# Patient Record
Sex: Male | Born: 1980 | Race: White | Hispanic: No | Marital: Married | Smoking: Never smoker
Health system: Southern US, Community
[De-identification: ages and names within clinical notes are randomized; demographics above are authoritative.]

## PROBLEM LIST (undated history)

## (undated) HISTORY — PX: APPENDECTOMY: SHX54

---

## 2006-04-27 ENCOUNTER — Emergency Department (HOSPITAL_COMMUNITY): Admission: EM | Admit: 2006-04-27 | Discharge: 2006-04-27 | Payer: Self-pay | Admitting: Emergency Medicine

## 2009-04-03 ENCOUNTER — Emergency Department (HOSPITAL_BASED_OUTPATIENT_CLINIC_OR_DEPARTMENT_OTHER): Admission: EM | Admit: 2009-04-03 | Discharge: 2009-04-03 | Payer: Self-pay | Admitting: Emergency Medicine

## 2011-01-28 LAB — CBC
HCT: 44.3 % (ref 39.0–52.0)
Hemoglobin: 15.1 g/dL (ref 13.0–17.0)
MCV: 92.9 fL (ref 78.0–100.0)
Platelets: 295 10*3/uL (ref 150–400)
RBC: 4.77 MIL/uL (ref 4.22–5.81)
WBC: 9.3 10*3/uL (ref 4.0–10.5)

## 2011-01-28 LAB — URINALYSIS, ROUTINE W REFLEX MICROSCOPIC
Glucose, UA: NEGATIVE mg/dL
Hgb urine dipstick: NEGATIVE
Specific Gravity, Urine: 1.02 (ref 1.005–1.030)
pH: 7 (ref 5.0–8.0)

## 2011-01-28 LAB — DIFFERENTIAL
Basophils Absolute: 0 10*3/uL (ref 0.0–0.1)
Basophils Relative: 0 % (ref 0–1)
Eosinophils Relative: 1 % (ref 0–5)
Monocytes Absolute: 0.7 10*3/uL (ref 0.1–1.0)
Neutro Abs: 7.4 10*3/uL (ref 1.7–7.7)

## 2011-01-28 LAB — COMPREHENSIVE METABOLIC PANEL
Albumin: 4.5 g/dL (ref 3.5–5.2)
Alkaline Phosphatase: 91 U/L (ref 39–117)
BUN: 12 mg/dL (ref 6–23)
CO2: 29 mEq/L (ref 19–32)
Chloride: 100 mEq/L (ref 96–112)
GFR calc non Af Amer: >60 mL/min (ref >60)
Potassium: 4 mEq/L (ref 3.5–5.1)
Total Bilirubin: 0.4 mg/dL (ref 0.3–1.2)

## 2011-01-28 LAB — ROCKY MTN SPOTTED FVR AB, IGG-BLOOD: RMSF IgG: 0.11 IV

## 2011-02-17 ENCOUNTER — Emergency Department (INDEPENDENT_AMBULATORY_CARE_PROVIDER_SITE_OTHER): Payer: 59

## 2011-02-17 ENCOUNTER — Emergency Department (HOSPITAL_BASED_OUTPATIENT_CLINIC_OR_DEPARTMENT_OTHER)
Admission: EM | Admit: 2011-02-17 | Discharge: 2011-02-17 | Disposition: A | Discharge status: Home / Self Care | Payer: 59 | Attending: Emergency Medicine | Admitting: Emergency Medicine

## 2011-02-17 DIAGNOSIS — Z79899 Other long term (current) drug therapy: Secondary | ICD-10-CM | POA: Insufficient documentation

## 2011-02-17 DIAGNOSIS — R509 Fever, unspecified: Secondary | ICD-10-CM | POA: Insufficient documentation

## 2011-02-17 DIAGNOSIS — F17200 Nicotine dependence, unspecified, uncomplicated: Secondary | ICD-10-CM

## 2011-02-17 LAB — COMPREHENSIVE METABOLIC PANEL
Alkaline Phosphatase: 88 U/L (ref 39–117)
BUN: 14 mg/dL (ref 6–23)
Calcium: 9.3 mg/dL (ref 8.4–10.5)
Glucose, Bld: 78 mg/dL (ref 70–99)
Total Protein: 7.9 g/dL (ref 6.0–8.3)

## 2011-02-17 LAB — URINALYSIS, ROUTINE W REFLEX MICROSCOPIC
Ketones, ur: NEGATIVE mg/dL
Nitrite: NEGATIVE
Protein, ur: NEGATIVE mg/dL
Urobilinogen, UA: 0.2 mg/dL (ref 0.0–1.0)

## 2011-02-17 LAB — DIFFERENTIAL
Basophils Relative: 0 % (ref 0–1)
Eosinophils Absolute: 0.1 10*3/uL (ref 0.0–0.7)
Monocytes Absolute: 1.2 10*3/uL — ABNORMAL HIGH (ref 0.1–1.0)
Monocytes Relative: 12 % (ref 3–12)
Neutrophils Relative %: 71 % (ref 43–77)

## 2011-02-17 LAB — CBC
MCH: 31.8 pg (ref 26.0–34.0)
MCHC: 35.6 g/dL (ref 30.0–36.0)
Platelets: 233 10*3/uL (ref 150–400)

## 2012-05-02 IMAGING — CR DG CHEST 2V
2 series · 2 of 2 positions shown · non-contrast
Comparison: None.

CLINICAL DATA: Fever.  Smoker.  Prior history of meningitis and
Koukos Kazaki fever.

CHEST - 2 VIEW 02/17/2011:

[w chest pa]
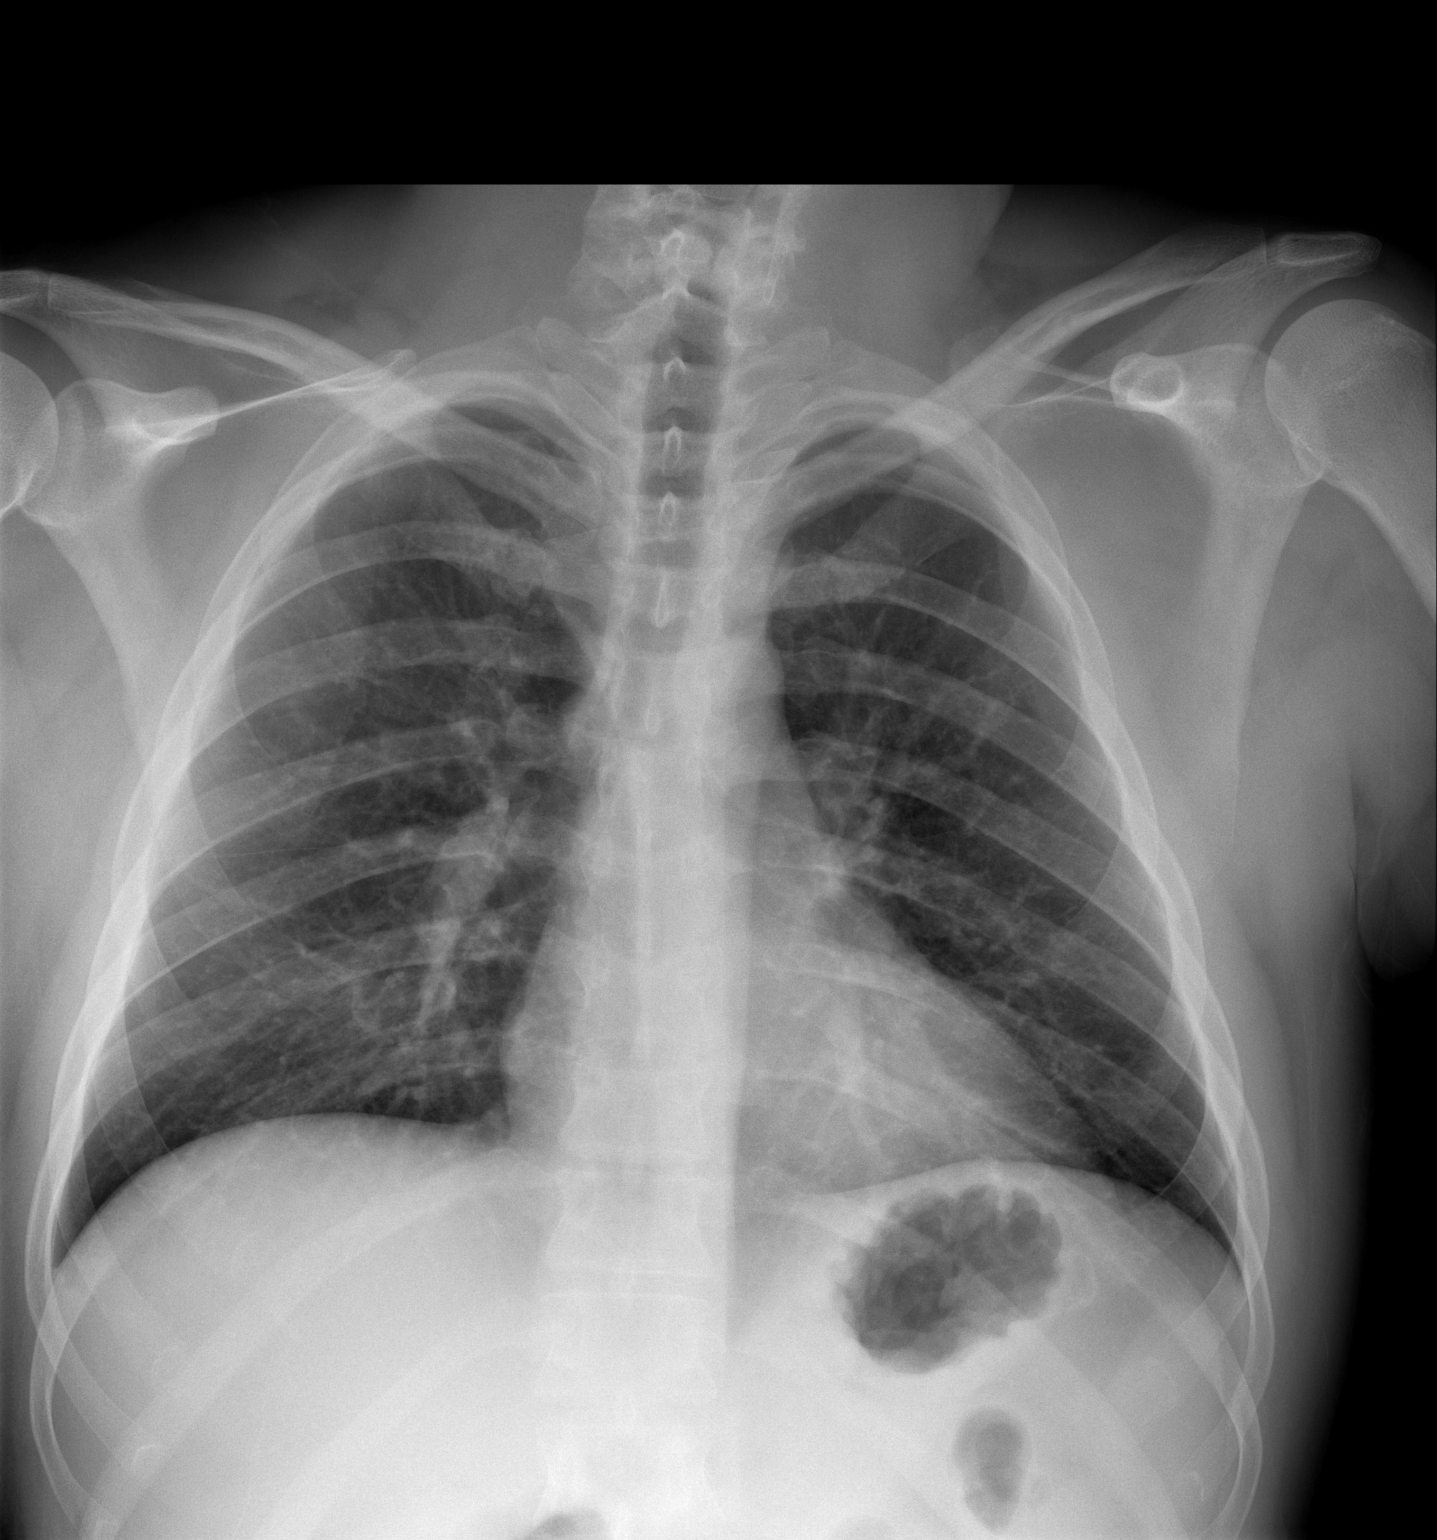

[w chest lat]
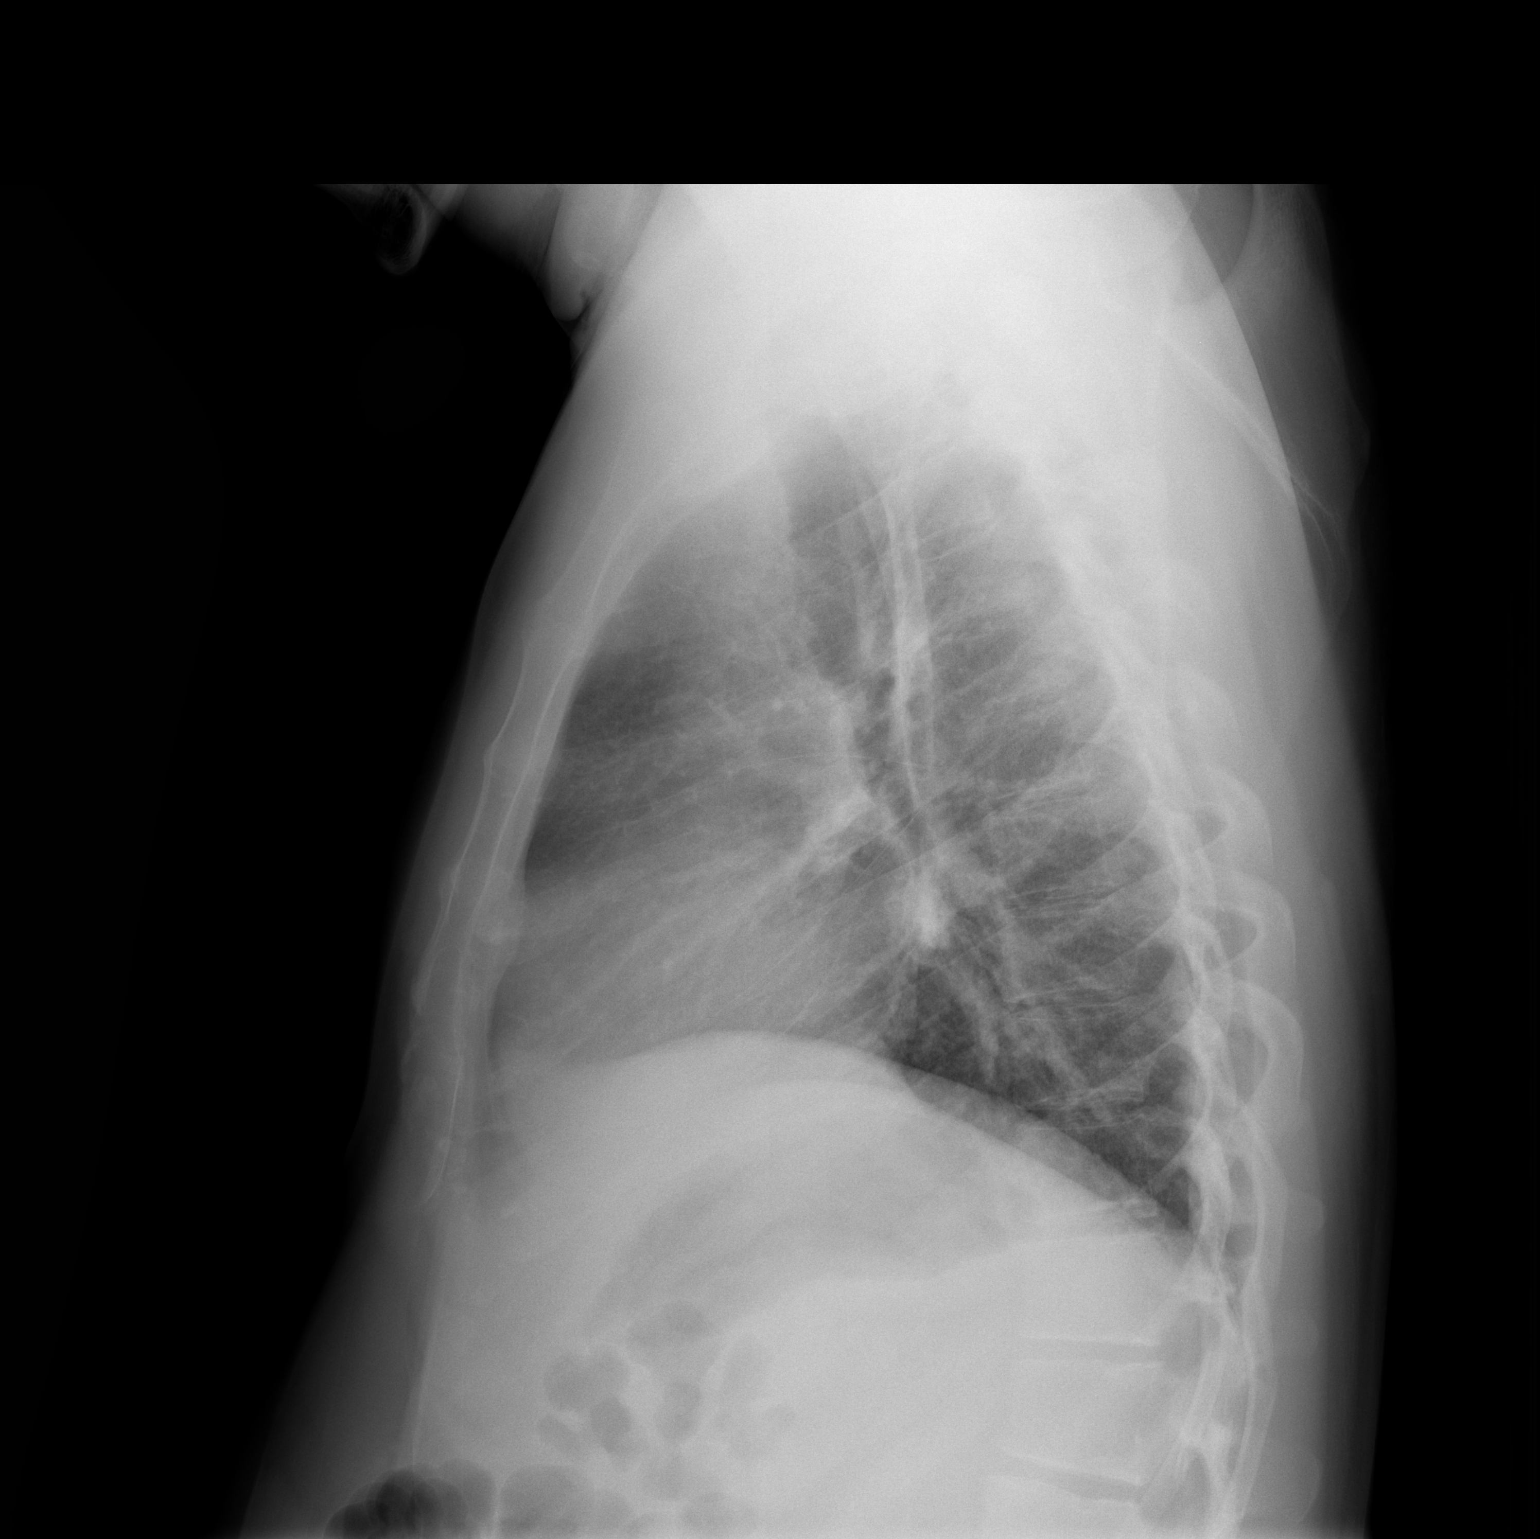

[2 of 2 positions shown; findings below may reference images not displayed]

FINDINGS: Cardiomediastinal silhouette unremarkable.  Mildly
prominent bronchovascular markings diffusely and mild central
peribronchial thickening.  Lungs otherwise clear.  No pleural
effusions.  Visualized bony thorax intact.
IMPRESSION: Mild changes of bronchitis and/or asthma without acute
abnormalities otherwise.

## 2013-07-02 ENCOUNTER — Ambulatory Visit: Payer: 59

## 2015-12-06 ENCOUNTER — Encounter (HOSPITAL_BASED_OUTPATIENT_CLINIC_OR_DEPARTMENT_OTHER): Payer: Self-pay

## 2015-12-06 ENCOUNTER — Emergency Department (HOSPITAL_BASED_OUTPATIENT_CLINIC_OR_DEPARTMENT_OTHER)
Admission: EM | Admit: 2015-12-06 | Discharge: 2015-12-06 | Disposition: A | Discharge status: Home / Self Care | Payer: 59 | Attending: Emergency Medicine | Admitting: Emergency Medicine

## 2015-12-06 ENCOUNTER — Emergency Department (HOSPITAL_BASED_OUTPATIENT_CLINIC_OR_DEPARTMENT_OTHER): Payer: 59

## 2015-12-06 DIAGNOSIS — Z79899 Other long term (current) drug therapy: Secondary | ICD-10-CM | POA: Diagnosis not present

## 2015-12-06 DIAGNOSIS — R55 Syncope and collapse: Secondary | ICD-10-CM | POA: Diagnosis present

## 2015-12-06 DIAGNOSIS — R42 Dizziness and giddiness: Secondary | ICD-10-CM | POA: Insufficient documentation

## 2015-12-06 DIAGNOSIS — R112 Nausea with vomiting, unspecified: Secondary | ICD-10-CM | POA: Insufficient documentation

## 2015-12-06 LAB — BASIC METABOLIC PANEL
Anion gap: 12 (ref 5–15)
BUN: 17 mg/dL (ref 6–20)
CHLORIDE: 102 mmol/L (ref 101–111)
CO2: 24 mmol/L (ref 22–32)
Calcium: 9.3 mg/dL (ref 8.9–10.3)
Creatinine, Ser: 0.93 mg/dL (ref 0.61–1.24)
GFR calc Af Amer: >60 mL/min (ref >60)
GFR calc non Af Amer: >60 mL/min (ref >60)
GLUCOSE: 152 mg/dL — AB (ref 65–99)
POTASSIUM: 3.4 mmol/L — AB (ref 3.5–5.1)
Sodium: 138 mmol/L (ref 135–145)

## 2015-12-06 LAB — CBC WITH DIFFERENTIAL/PLATELET
Basophils Absolute: 0 10*3/uL (ref 0.0–0.1)
Basophils Relative: 0 %
Eosinophils Absolute: 0.2 10*3/uL (ref 0.0–0.7)
Eosinophils Relative: 1 %
HEMATOCRIT: 44 % (ref 39.0–52.0)
HEMOGLOBIN: 15.2 g/dL (ref 13.0–17.0)
LYMPHS ABS: 1.5 10*3/uL (ref 0.7–4.0)
LYMPHS PCT: 9 %
MCH: 30.8 pg (ref 26.0–34.0)
MCHC: 34.5 g/dL (ref 30.0–36.0)
MCV: 89.2 fL (ref 78.0–100.0)
Monocytes Absolute: 1 10*3/uL (ref 0.1–1.0)
Monocytes Relative: 6 %
NEUTROS PCT: 84 %
Neutro Abs: 13.4 10*3/uL — ABNORMAL HIGH (ref 1.7–7.7)
Platelets: 318 10*3/uL (ref 150–400)
RBC: 4.93 MIL/uL (ref 4.22–5.81)
RDW: 11.6 % (ref 11.5–15.5)
WBC: 16.1 10*3/uL — AB (ref 4.0–10.5)

## 2015-12-06 MED ORDER — METOCLOPRAMIDE HCL 5 MG/ML IJ SOLN
10.0000 mg | Freq: Once | INTRAMUSCULAR | Status: AC
  Administered 2015-12-06: 10 mg via INTRAVENOUS
  Filled 2015-12-06: qty 2

## 2015-12-06 MED ORDER — ONDANSETRON HCL 4 MG/2ML IJ SOLN
4.0000 mg | Freq: Once | INTRAMUSCULAR | Status: AC
  Administered 2015-12-06: 4 mg via INTRAVENOUS
  Filled 2015-12-06: qty 2

## 2015-12-06 MED ORDER — ONDANSETRON 8 MG PO TBDP: 8.0000 mg | ORAL_TABLET | Freq: Three times a day (TID) | ORAL | Status: AC | PRN

## 2015-12-06 MED ORDER — SODIUM CHLORIDE 0.9 % IV BOLUS (SEPSIS)
1000.0000 mL | Freq: Once | INTRAVENOUS | Status: AC
  Administered 2015-12-06: 1000 mL via INTRAVENOUS

## 2015-12-06 MED ORDER — SODIUM CHLORIDE 0.9 % IV BOLUS (SEPSIS)
1000.0000 mL | Freq: Once | INTRAVENOUS | Status: AC
Start: 2015-12-06 — End: 2015-12-06
  Administered 2015-12-06: 1000 mL via INTRAVENOUS

## 2015-12-06 NOTE — ED Notes (Signed)
Patient transported to CT 

## 2015-12-06 NOTE — ED Notes (Signed)
Pt c/o not being able to take deep breath, put on O2 nasal cannula for comfort and relaxation.  Pt continues to voice small improvement with fluids.

## 2015-12-06 NOTE — ED Notes (Signed)
Pt ambulated to restroom without assistance.

## 2015-12-06 NOTE — ED Notes (Signed)
Pt verbalizes understanding of d/c instructions and denies any further need at this time. 

## 2015-12-06 NOTE — ED Notes (Signed)
Pt tolerated small sips of PO fluids

## 2015-12-06 NOTE — ED Provider Notes (Signed)
CSN: [id number]     Arrival date & time 12/06/15  1191 History   First MD Initiated Contact with Patient 12/06/15 [phone]     Chief Complaint  Patient presents with  . Syncope      (Consider location/radiation/quality/duration/timing/severity/associated sxs/prior Treatment) HPI  This is a 35 year old male who got up about an hour prior to arrival complaining of abdominal discomfort and nausea. He took Gas-X without relief. His wife, a paramedic, noted him to be pale and diaphoretic and he complained of feeling lightheaded. He subsequently began vomiting profusely during which had a syncopal episode. He was unconscious for about 1-1/2 minutes. There was some jerking suggestive of seizure-like activity. He continues to feel weak, has a dry mouth and he feels like he is having difficulty getting words out though he is oriented 4. He denies hitting his head.  History reviewed. No pertinent past medical history. Past Surgical History  Procedure Laterality Date  . Appendectomy     Family History  Problem Relation Age of Onset  . Stroke Mother    Social History  Substance Use Topics  . Smoking status: Never Smoker   . Smokeless tobacco: None  . Alcohol Use: None    Review of Systems  All other systems reviewed and are negative.   Allergies  Review of patient's allergies indicates no known allergies.  Home Medications   Prior to Admission medications   Medication Sig Start Date End Date Taking? Authorizing Provider  desvenlafaxine (PRISTIQ) 50 MG 24 hr tablet Take 50 mg by mouth daily.    Historical Provider, MD   BP 122/81 mmHg  Pulse 76  Temp(Src) 97.8 F (36.6 C) (Oral)  Resp 15  Ht  (1.727 m)  Wt 180 lb (81.647 kg)  BMI 27.38 kg/m2  SpO2 98%   Physical Exam  General: Well-developed, well-nourished male in no acute distress; appearance consistent with age of record HENT: normocephalic; bite mark to right side of tongue Eyes: pupils equal, round and reactive to  light; extraocular muscles intact Neck: supple Heart: regular rate and rhythm Lungs: clear to auscultation bilaterally Abdomen: soft; nondistended; nontender; no masses or hepatosplenomegaly; bowel sounds present Extremities: No deformity; full range of motion; pulses normal Neurologic: Awake, drowsy, oriented; motor function intact in all extremities and symmetric; no facial droop Skin: Warm and dry Psychiatric: Flat affect  ED Course  Procedures (including critical care time)   EKG Interpretation   Date/Time:  Wednesday December 06 2015 04:29:05 EST Ventricular Rate:  82 PR Interval:  164 QRS Duration: 86 QT Interval:  366 QTC Calculation: 427 R Axis:   3 Text Interpretation:  Sinus rhythm Low voltage, precordial leads No  previous ECGs available Confirmed by Taliah Porche  MD, Jonny Ruiz ([id number]) on 12/06/2015  4:32:04 AM      MDM  Nursing notes and vitals signs, including pulse oximetry, reviewed.  Summary of this visit's results, reviewed by myself:  Labs:  Results for orders placed or performed during the hospital encounter of 12/06/15 (from the past 24 hour(s))  CBC with Differential/Platelet     Status: Abnormal   Collection Time: 12/06/15  4:46 AM  Result Value Ref Range   WBC 16.1 (H) 4.0 - 10.5 K/uL   RBC 4.93 4.22 - 5.81 MIL/uL   Hemoglobin 15.2 13.0 - 17.0 g/dL   HCT 30.8 65.7 - 84.6 %   MCV 89.2 78.0 - 100.0 fL   MCH 30.8 26.0 - 34.0 pg   MCHC 34.5 30.0 - 36.0 g/dL  RDW 11.6 11.5 - 15.5 %   Platelets 318 150 - 400 K/uL   Neutrophils Relative % 84 %   Neutro Abs 13.4 (H) 1.7 - 7.7 K/uL   Lymphocytes Relative 9 %   Lymphs Abs 1.5 0.7 - 4.0 K/uL   Monocytes Relative 6 %   Monocytes Absolute 1.0 0.1 - 1.0 K/uL   Eosinophils Relative 1 %   Eosinophils Absolute 0.2 0.0 - 0.7 K/uL   Basophils Relative 0 %   Basophils Absolute 0.0 0.0 - 0.1 K/uL  Basic metabolic panel     Status: Abnormal   Collection Time: 12/06/15  4:46 AM  Result Value Ref Range   Sodium 138  135 - 145 mmol/L   Potassium 3.4 (L) 3.5 - 5.1 mmol/L   Chloride 102 101 - 111 mmol/L   CO2 24 22 - 32 mmol/L   Glucose, Bld 152 (H) 65 - 99 mg/dL   BUN 17 6 - 20 mg/dL   Creatinine, Ser 1.61 0.61 - 1.24 mg/dL   Calcium 9.3 8.9 - 09.6 mg/dL   GFR calc non Af Amer >60 >60 mL/min   GFR calc Af Amer >60 >60 mL/min   Anion gap 12 5 - 15    Imaging Studies: Ct Head Wo Contrast  12/06/2015  CLINICAL DATA:  Abdominal pain and nausea, near syncope. EXAM: CT HEAD WITHOUT CONTRAST TECHNIQUE: Contiguous axial images were obtained from the base of the skull through the vertex without intravenous contrast. COMPARISON:  None. FINDINGS: The ventricles and sulci are normal. No intraparenchymal hemorrhage, mass effect nor midline shift. No acute large vascular territory infarcts. No abnormal extra-axial fluid collections. Basal cisterns are patent. No skull fracture. The included ocular globes and orbital contents are non-suspicious. The mastoid aircells and included paranasal sinuses are well-aerated. IMPRESSION: Negative CT head. Electronically Signed   By: Awilda Metro M.D.   On: 12/06/2015 05:28   6:28 AM This drinking fluids without emesis. Has been ambulating without difficulty. Continues to feel weak. His wife is a paramedic who will observe him for any worsening symptoms. History is more consistent with a gastrointestinal virus causing a syncopal episode as opposed to a seizure. The nausea, vomiting and abdominal pain preceded his loss of consciousness.  Paula Libra, MD 12/06/15 [phone]

## 2015-12-06 NOTE — ED Notes (Signed)
Pt expressing some relief of symptoms from start of fluids and nausea medication.  Pt remains oriented and talking, still has generalized weakness and is not able to fully participate in care.  Wife remains at bedside, vital signs stable and pt on monitor.

## 2015-12-06 NOTE — ED Notes (Signed)
Pt woke with sudden onset middle abdominal pain and vomiting.  Pt was in the restroom when he called out to his wife stating that something wasn't right and became very diaphoretic and pale and he passed out per wife for approximately 2 minutes and started "posturing" for about 30 minutes.  Pt is very weak, slightly pale, c/o dry mouth and states he continues to have abdominal pain.  Pt is oriented times four, denies hitting head.

## 2015-12-06 NOTE — ED Notes (Signed)
Pt continues to show improvement, no delay in speech and was able to assist in moving self over in CT.

## 2017-02-18 IMAGING — CT CT HEAD W/O CM
2 series · 16 of 30 positions shown, 18 images · non-contrast
Comparison: None.

CLINICAL DATA: Abdominal pain and nausea, near syncope.

EXAM:
CT HEAD WITHOUT CONTRAST
TECHNIQUE: Contiguous axial images were obtained from the base of the skull
through the vertex without intravenous contrast.

[Series 2: head wo · axial · 0.49mm/px · z∈[-140,-15]mm · 8 of 33 slices shown, 10 images]
[im 4/33  brain]
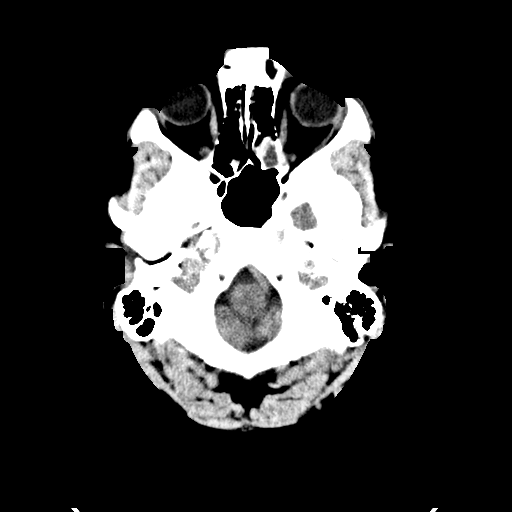
[im 4/33  bone]
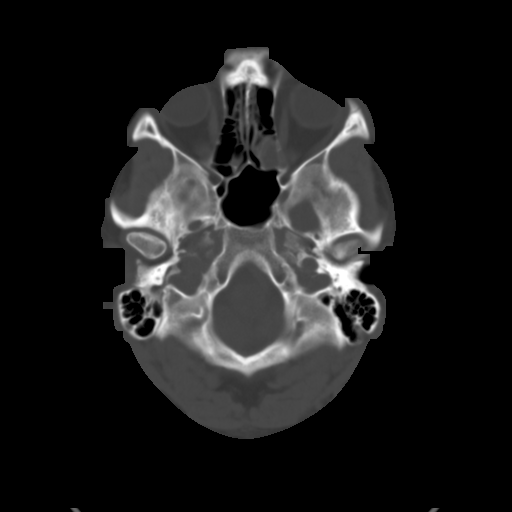
[im 8/33  brain]
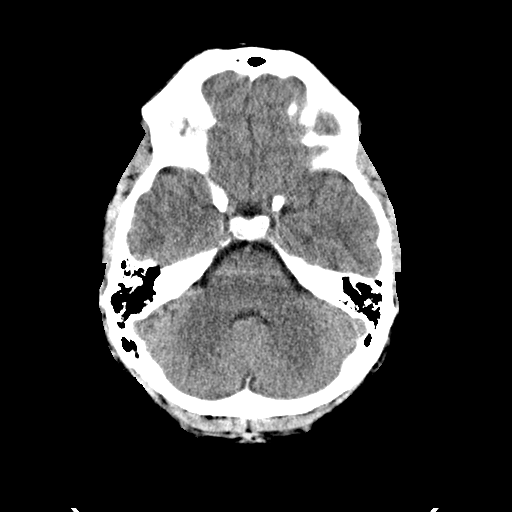
[im 11/33  brain]
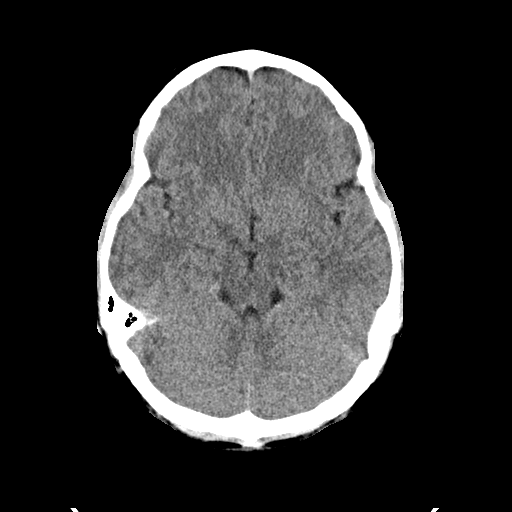
[im 15/33  brain]
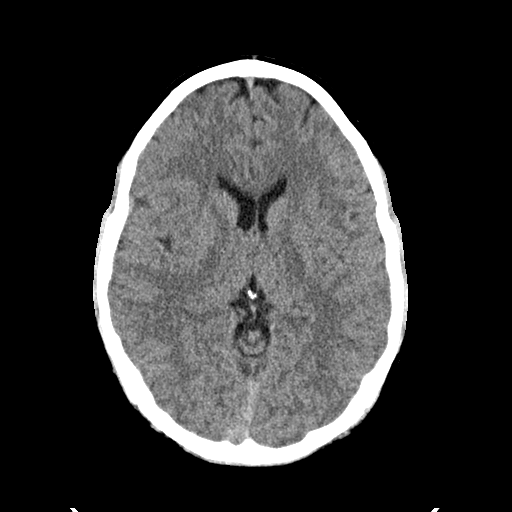
[im 18/33  brain]
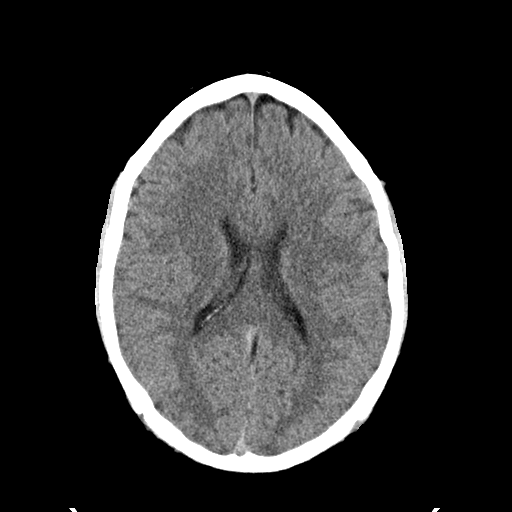
[im 18/33  bone]
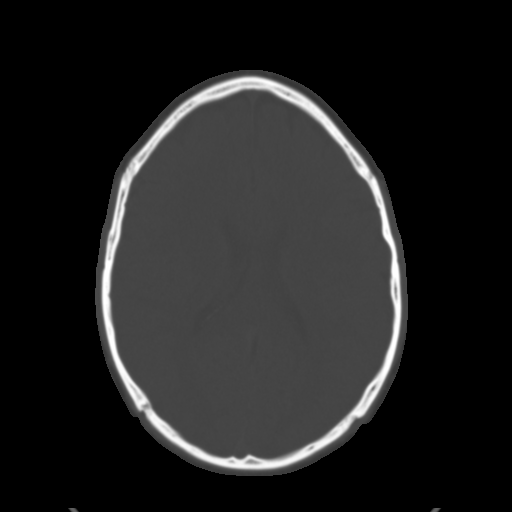
[im 22/33  brain]
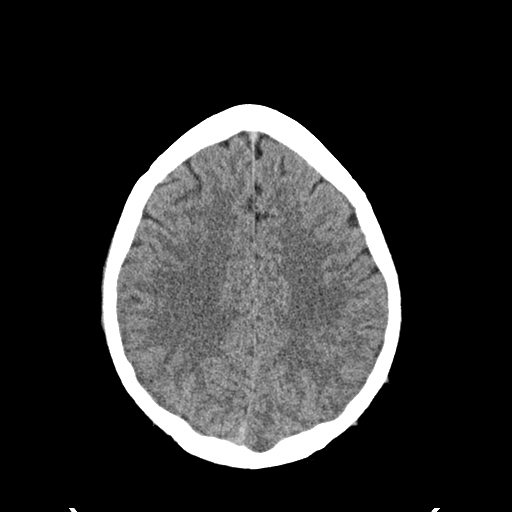
[im 25/33  brain]
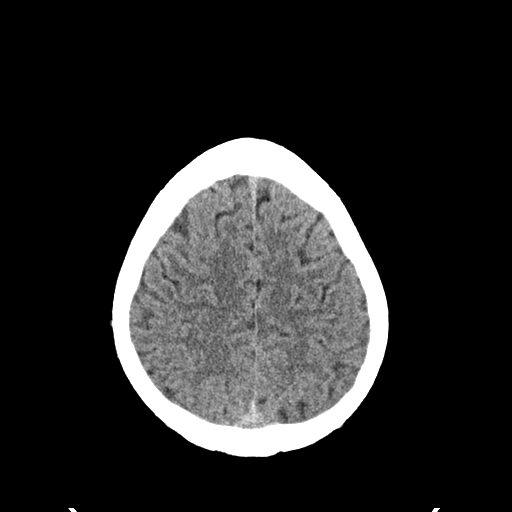
[im 29/33  brain]
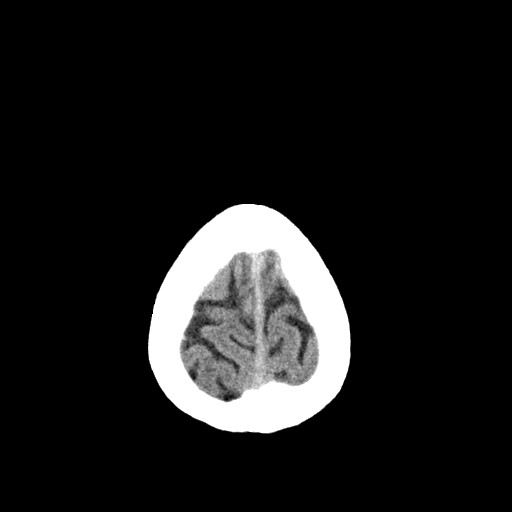

[Series 3: head bone · axial · 0.49mm/px · z∈[-141,-11]mm · 8 of 66 slices shown]
[im 7/66  bone]
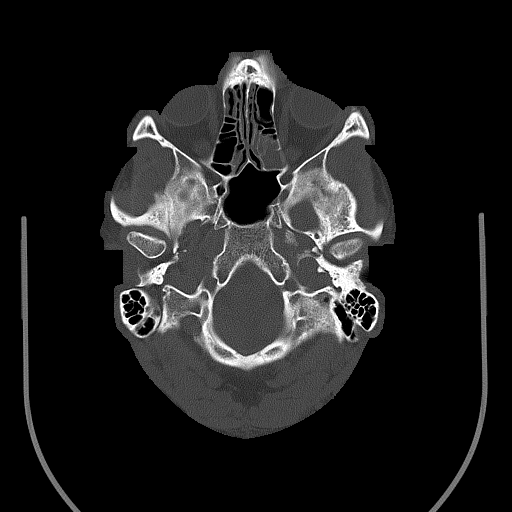
[im 14/66  bone]
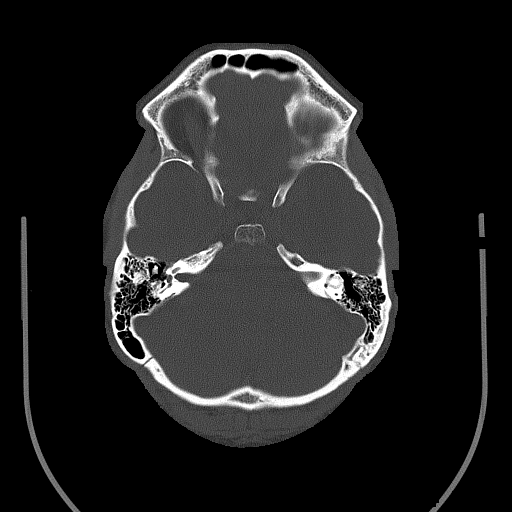
[im 21/66  bone]
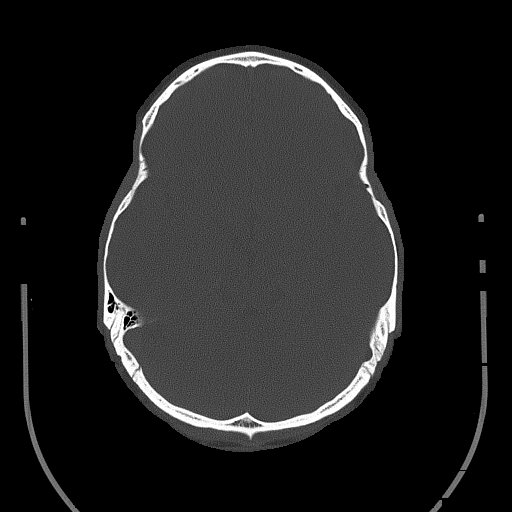
[im 28/66  bone]
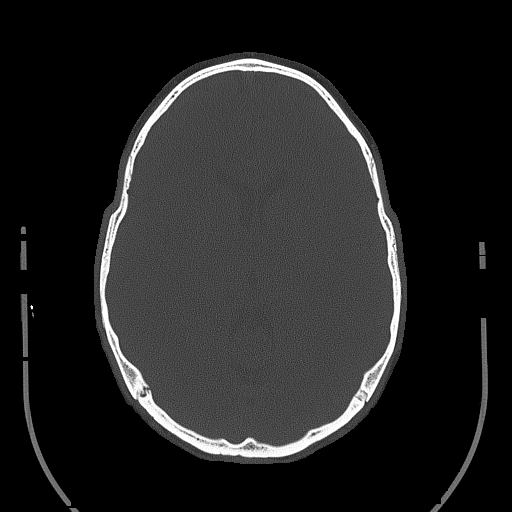
[im 38/66  bone]
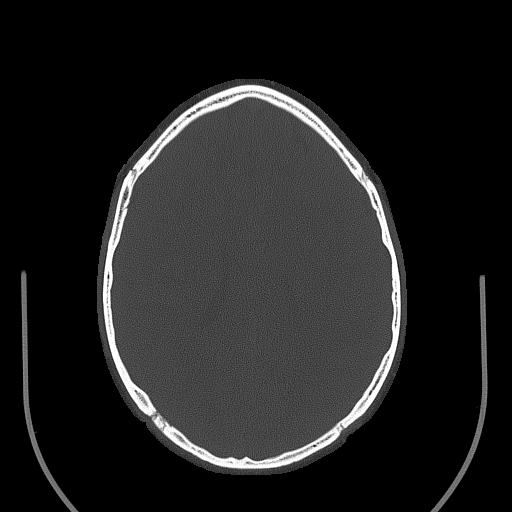
[im 45/66  bone]
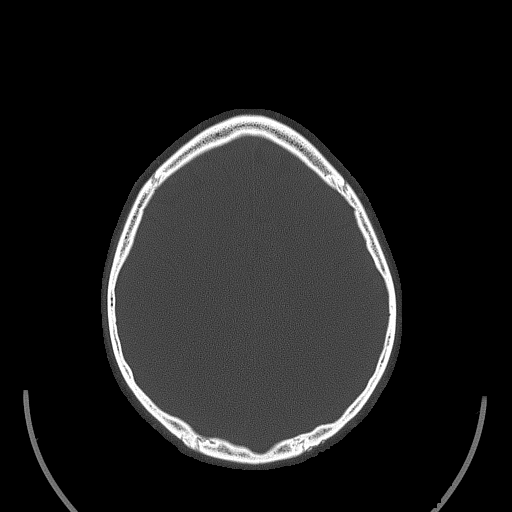
[im 52/66  bone]
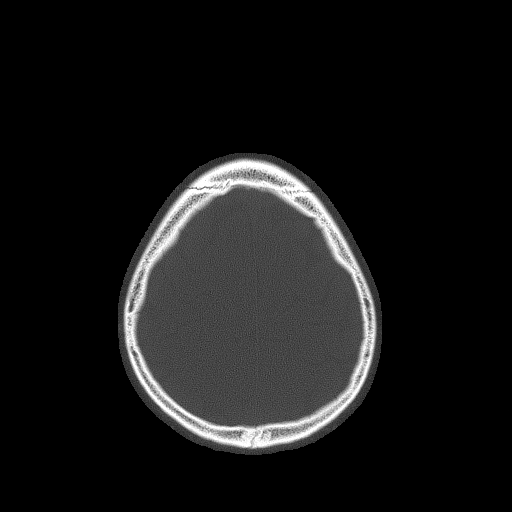
[im 59/66  bone]
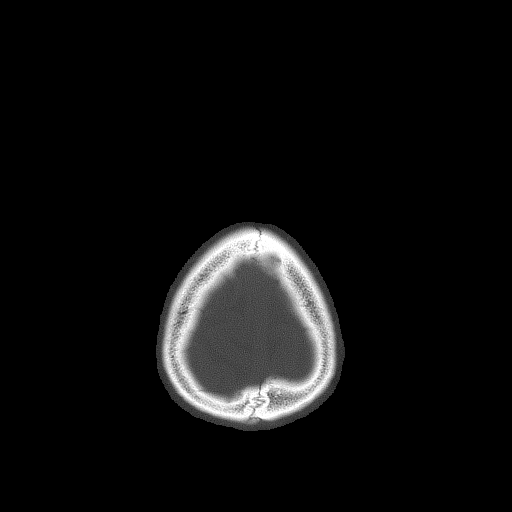

[16 of 30 positions shown; findings below may reference images not displayed]

FINDINGS: The ventricles and sulci are normal. No intraparenchymal hemorrhage,
mass effect nor midline shift. No acute large vascular territory
infarcts.

No abnormal extra-axial fluid collections. Basal cisterns are
patent.

No skull fracture. The included ocular globes and orbital contents
are non-suspicious. The mastoid aircells and included paranasal
sinuses are well-aerated.
IMPRESSION: Negative CT head.
